# Patient Record
Sex: Male | Born: 1985 | Race: White | Hispanic: No | Marital: Married | Smoking: Former smoker
Health system: Southern US, Community
[De-identification: ages and names within clinical notes are randomized; demographics above are authoritative.]

---

## 2013-06-21 ENCOUNTER — Ambulatory Visit (INDEPENDENT_AMBULATORY_CARE_PROVIDER_SITE_OTHER): Payer: BC Managed Care – PPO

## 2013-06-21 ENCOUNTER — Encounter: Payer: Self-pay | Admitting: Sports Medicine

## 2013-06-21 ENCOUNTER — Ambulatory Visit (INDEPENDENT_AMBULATORY_CARE_PROVIDER_SITE_OTHER): Payer: BC Managed Care – PPO | Admitting: Sports Medicine

## 2013-06-21 VITALS — BP 123/80 | HR 102 | Ht 74.0 in | Wt 216.0 lb

## 2013-06-21 DIAGNOSIS — M5412 Radiculopathy, cervical region: Secondary | ICD-10-CM

## 2013-06-21 DIAGNOSIS — M542 Cervicalgia: Secondary | ICD-10-CM

## 2013-06-21 DIAGNOSIS — M5136 Other intervertebral disc degeneration, lumbar region: Secondary | ICD-10-CM

## 2013-06-21 DIAGNOSIS — M51379 Other intervertebral disc degeneration, lumbosacral region without mention of lumbar back pain or lower extremity pain: Secondary | ICD-10-CM

## 2013-06-21 DIAGNOSIS — M51369 Other intervertebral disc degeneration, lumbar region without mention of lumbar back pain or lower extremity pain: Secondary | ICD-10-CM | POA: Insufficient documentation

## 2013-06-21 DIAGNOSIS — M5137 Other intervertebral disc degeneration, lumbosacral region: Secondary | ICD-10-CM

## 2013-06-21 MED ORDER — MELOXICAM 15 MG PO TABS: ORAL_TABLET | ORAL | Status: AC

## 2013-06-21 MED ORDER — PREDNISONE 50 MG PO TABS: ORAL_TABLET | ORAL | Status: AC

## 2013-06-21 MED ORDER — CYCLOBENZAPRINE HCL 10 MG PO TABS
ORAL_TABLET | ORAL | Status: AC
Start: 2013-06-21 — End: ?

## 2013-06-21 NOTE — Progress Notes (Signed)
  Subjective:    CC: Low back pain, neck pain  HPI:  Neck pain: Present for approximately 2 weeks, worse with flexion of the neck, radiating down the right arm in a C8 distribution. Pain is moderate, persistent.  Lumbar degenerative disc disease: Has a history of L4-5 and L5-S1 degenerative disc disease per patient's report of an MRI from the past. He has done physical therapy, steroids, NSAIDs, muscle relaxers, unfortunately continues to have pain in the back, with radicular symptoms radiating down the left leg in an S1 distribution. Pain is moderate, persistent.  Past medical history, Surgical history, Family history not pertinant except as noted below, Social history, Allergies, and medications have been entered into the medical record, reviewed, and no changes needed.   Review of Systems: No headache, visual changes, nausea, vomiting, diarrhea, constipation, dizziness, abdominal pain, skin rash, fevers, chills, night sweats, swollen lymph nodes, weight loss, chest pain, body aches, joint swelling, muscle aches, shortness of breath, mood changes, visual or auditory hallucinations.  Objective:    General: Well Developed, well nourished, and in no acute distress.  Neuro: Alert and oriented x3, extra-ocular muscles intact, sensation grossly intact.  HEENT: Normocephalic, atraumatic, pupils equal round reactive to light, neck supple, no masses, no lymphadenopathy, thyroid nonpalpable.  Skin: Warm and dry, no rashes noted.  Cardiac: Regular rate and rhythm, no murmurs rubs or gallops.  Respiratory: Clear to auscultation bilaterally. Not using accessory muscles, speaking in full sentences.  Abdominal: Soft, nontender, nondistended, positive bowel sounds, no masses, no organomegaly.  Neck: Inspection unremarkable. Tender to palpation along the right paracervical muscles. Positive Spurling's maneuver Full neck range of motion Grip strength and sensation normal in bilateral hands Strength good  C4 to T1 distribution No sensory change to C4 to T1 Negative Hoffman sign bilaterally Reflexes normal Back Exam:  Inspection: Unremarkable  Motion: Flexion 45 deg, Extension 45 deg, Side Bending to 45 deg bilaterally,  Rotation to 45 deg bilaterally  SLR laying: Negative  XSLR laying: Negative  Palpable tenderness: None. FABER: negative. Sensory change: Gross sensation intact to all lumbar and sacral dermatomes.  Reflexes: 2+ at both patellar tendons, 2+ at achilles tendons, Babinski's downgoing.  Strength at foot  Plantar-flexion: 5/5 Dorsi-flexion: 5/5 Eversion: 5/5 Inversion: 5/5  Leg strength  Quad: 5/5 Hamstring: 5/5 Hip flexor: 5/5 Hip abductors: 5/5  Gait unremarkable.  Cervical spine x-rays were reviewed and do show some straightening of the normal lordosis as well as loss of disc height at the C7-T1 level.  Impression and Recommendations:    The patient was counselled, risk factors were discussed, anticipatory guidance given.

## 2013-06-21 NOTE — Assessment & Plan Note (Signed)
This is new, right-sided C8. Prednisone, Flexeril, formal physical therapy, x-rays. Return to see me in 3-4 weeks, MRI for interventional injection planning if no better.

## 2013-06-21 NOTE — Assessment & Plan Note (Signed)
Left-sided S1 radiculitis. He does have an MRI and will bring this at the next visit. He has failed steroids, NSAIDs, muscle relaxers, physical therapy, he will be a candidate for an epidural.

## 2013-06-24 ENCOUNTER — Encounter: Payer: Self-pay | Admitting: Sports Medicine

## 2013-06-30 ENCOUNTER — Ambulatory Visit: Payer: BC Managed Care – PPO

## 2013-07-04 ENCOUNTER — Ambulatory Visit: Payer: BC Managed Care – PPO

## 2015-01-03 IMAGING — CR DG CERVICAL SPINE COMPLETE 4+V
6 series · 6 of 6 positions shown · non-contrast
Comparison: None.

CLINICAL DATA: Right neck pain/radiculopathy

EXAM:
CERVICAL SPINE  4+ VIEWS

[view not recorded (1 of 6)]
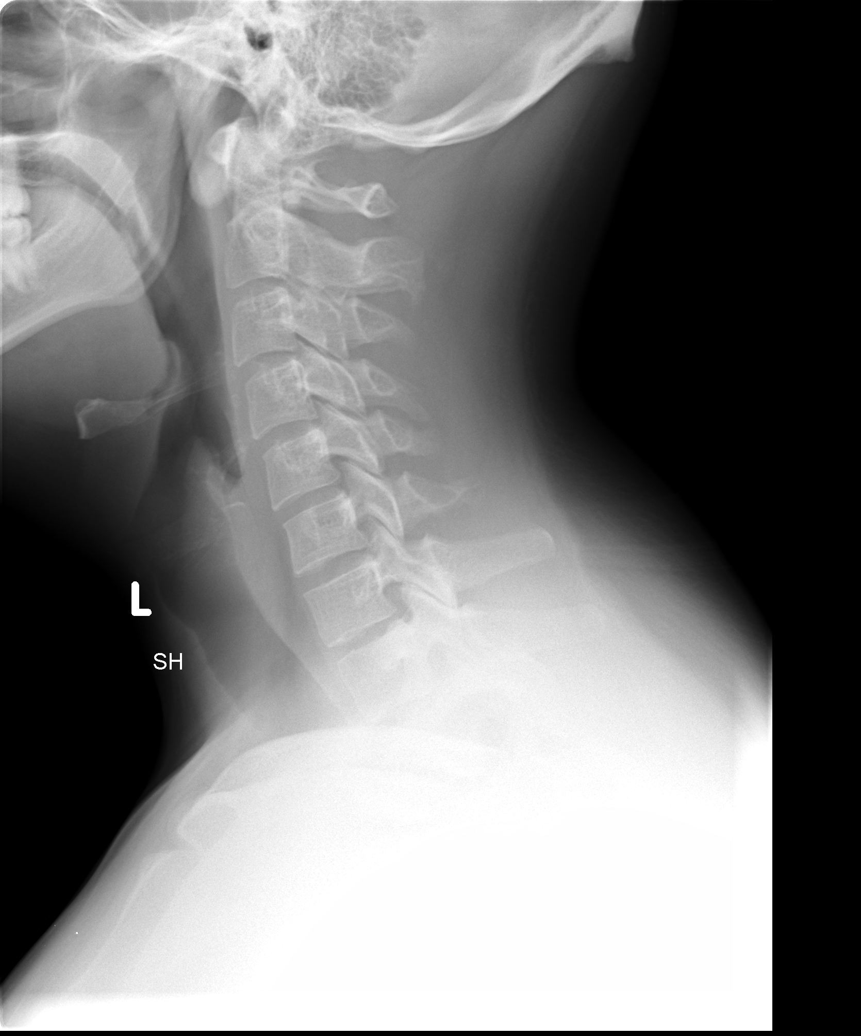

[view not recorded (2 of 6)]
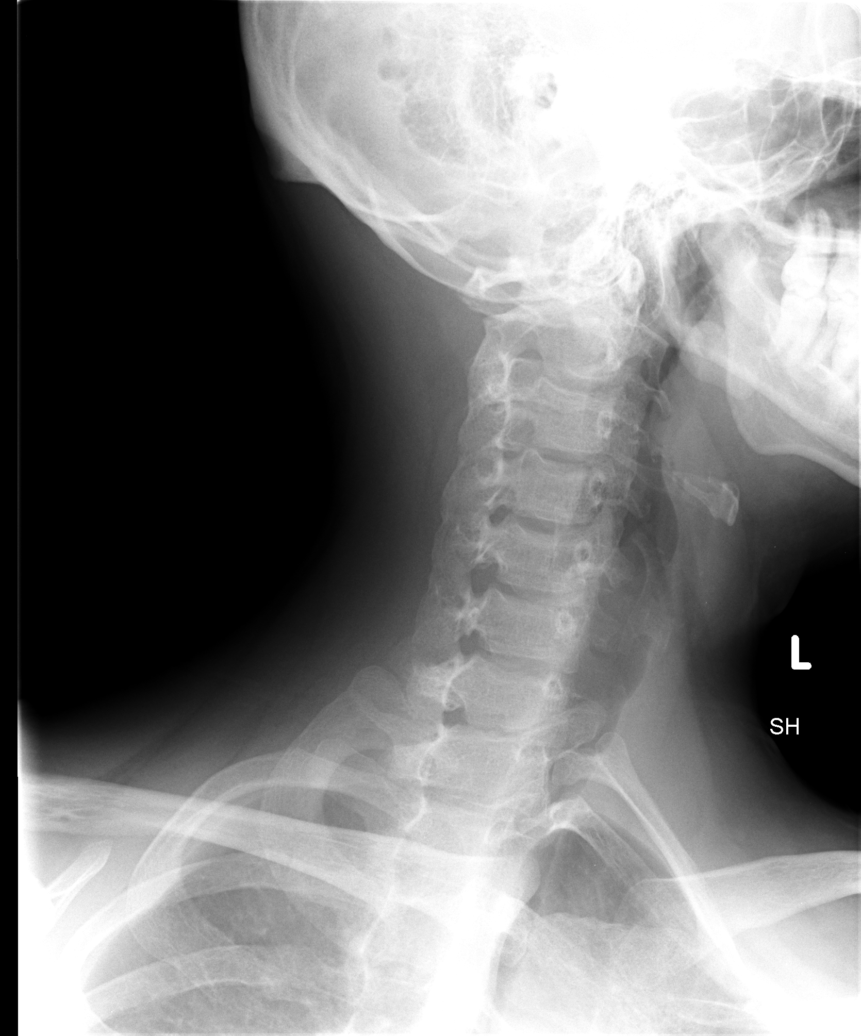

[view not recorded (3 of 6)]
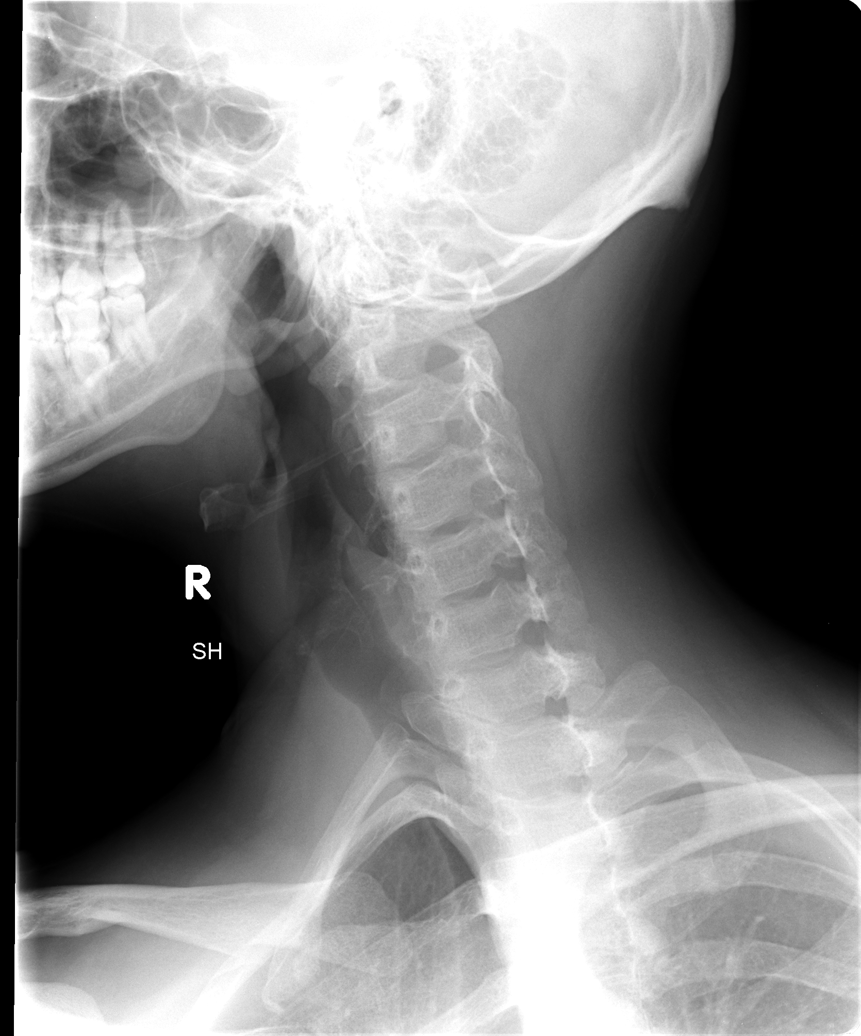

[view not recorded (4 of 6)]
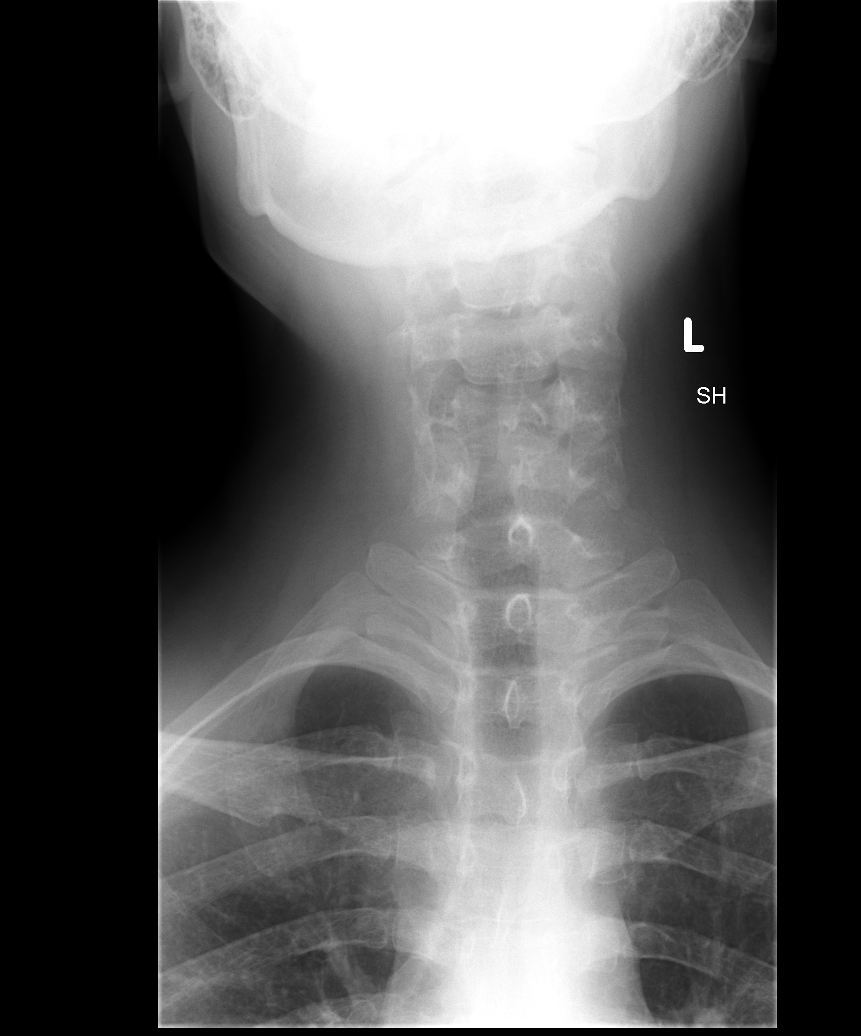

[view not recorded (5 of 6)]
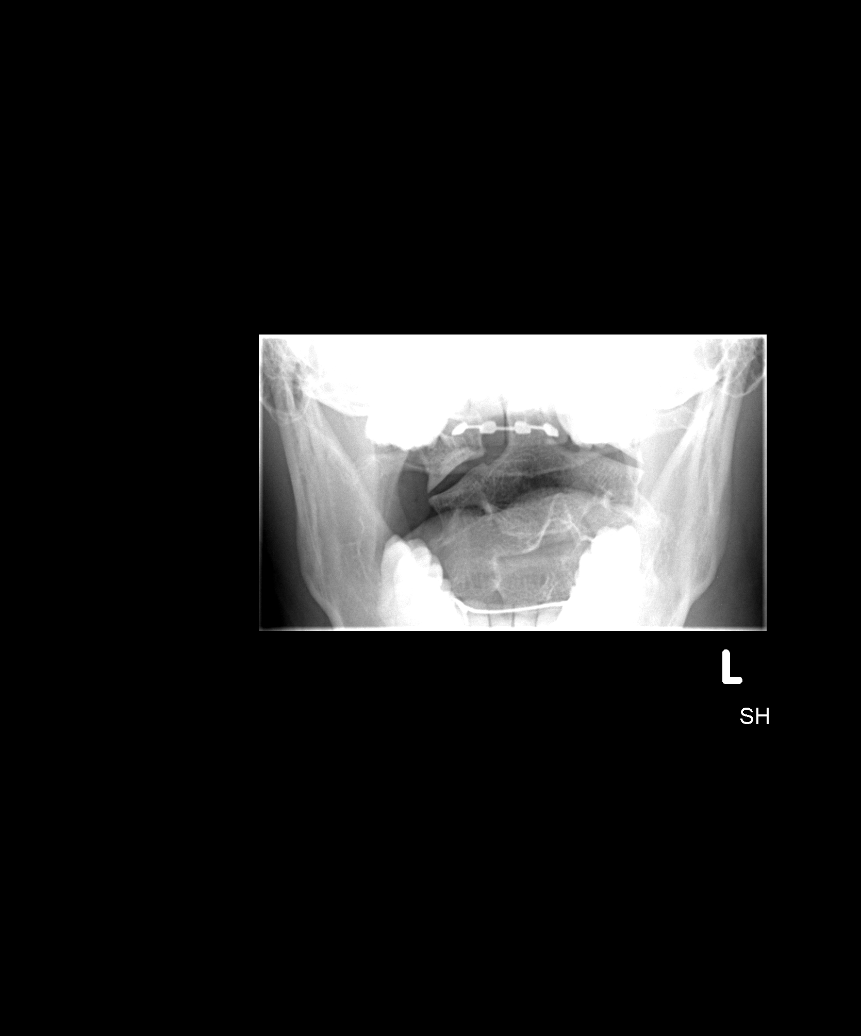

[view not recorded (6 of 6)]
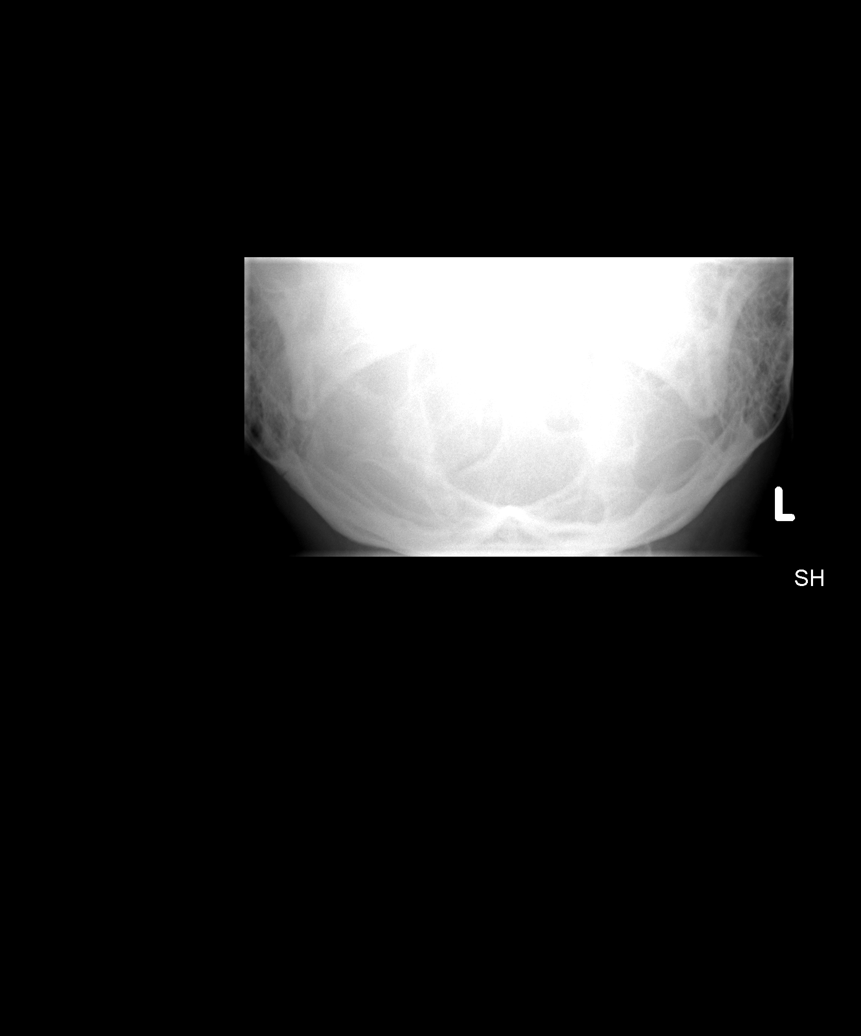

[6 of 6 positions shown; findings below may reference images not displayed]

FINDINGS: Cervical spine is visualized to the bottom of T1 on the lateral
view.

Normal cervical lordosis.

No evidence of fracture dislocation. Vertebral body heights and
intervertebral disc spaces are maintained. Dens appears intact.
Lateral masses C1 are symmetric.

No prevertebral soft tissue swelling.

Bilateral neural foramina are patent.

Visualized lung apices are clear.
IMPRESSION: Normal cervical spine radiographs.
